# Patient Record
Sex: Male | Born: 1990 | Race: Black or African American | Hispanic: No | Marital: Single | Smoking: Never smoker
Health system: Southern US, Community
[De-identification: ages and names within clinical notes are randomized; demographics above are authoritative.]

## PROBLEM LIST (undated history)

## (undated) DIAGNOSIS — I861 Scrotal varices: Secondary | ICD-10-CM

## (undated) DIAGNOSIS — F419 Anxiety disorder, unspecified: Secondary | ICD-10-CM

## (undated) DIAGNOSIS — A630 Anogenital (venereal) warts: Secondary | ICD-10-CM

## (undated) DIAGNOSIS — L299 Pruritus, unspecified: Secondary | ICD-10-CM

---

## 2015-02-21 DIAGNOSIS — I861 Scrotal varices: Secondary | ICD-10-CM

## 2015-02-21 HISTORY — DX: Scrotal varices: I86.1

## 2015-03-07 ENCOUNTER — Other Ambulatory Visit: Payer: Self-pay | Admitting: Family Medicine

## 2015-03-07 DIAGNOSIS — N50819 Testicular pain, unspecified: Secondary | ICD-10-CM

## 2015-03-11 ENCOUNTER — Ambulatory Visit
Admission: RE | Admit: 2015-03-11 | Discharge: 2015-03-11 | Disposition: A | Discharge status: Home / Self Care | Payer: BLUE CROSS/BLUE SHIELD | Source: Ambulatory Visit | Attending: Family Medicine | Admitting: Family Medicine

## 2015-03-11 DIAGNOSIS — N50819 Testicular pain, unspecified: Secondary | ICD-10-CM

## 2016-08-07 IMAGING — US US ART/VEN ABD/PELV/SCROTUM DOPPLER LTD
1 series · 14 of 25 positions shown · non-contrast
Comparison: None.

CLINICAL DATA: Chronic intermittent testicle pain.

EXAM:
SCROTAL ULTRASOUND
DOPPLER ULTRASOUND OF THE TESTICLES
TECHNIQUE: Complete ultrasound examination of the testicles, epididymis, and
other scrotal structures was performed. Color and spectral Doppler
ultrasound were also utilized to evaluate blood flow to the
testicles.

[Series 1: us art/ven abd/pelv/scrotum doppler ltd · 0.06mm/px · 14 of 60 slices shown]
[im 1/60]
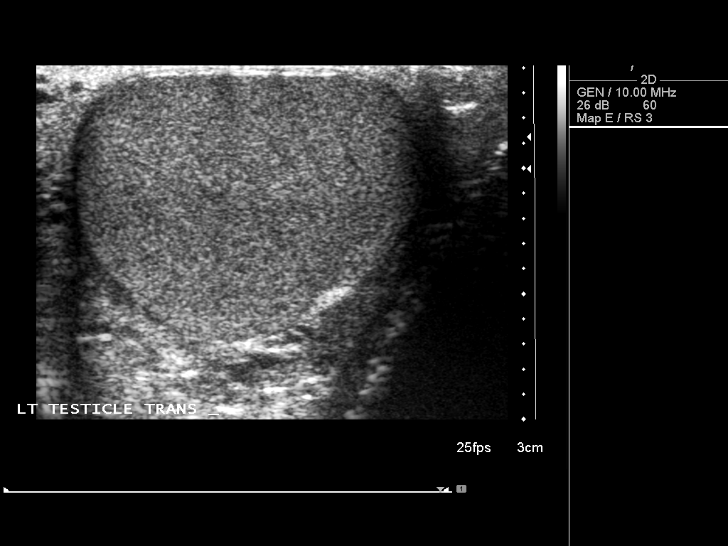
[im 5/60]
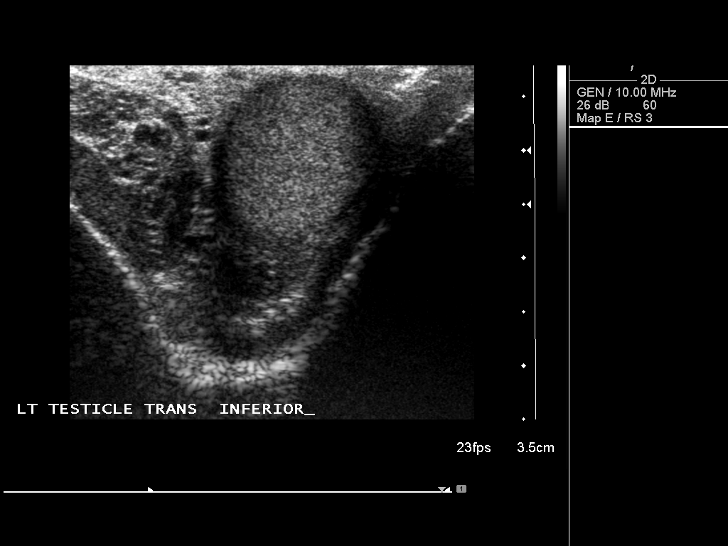
[im 10/60]
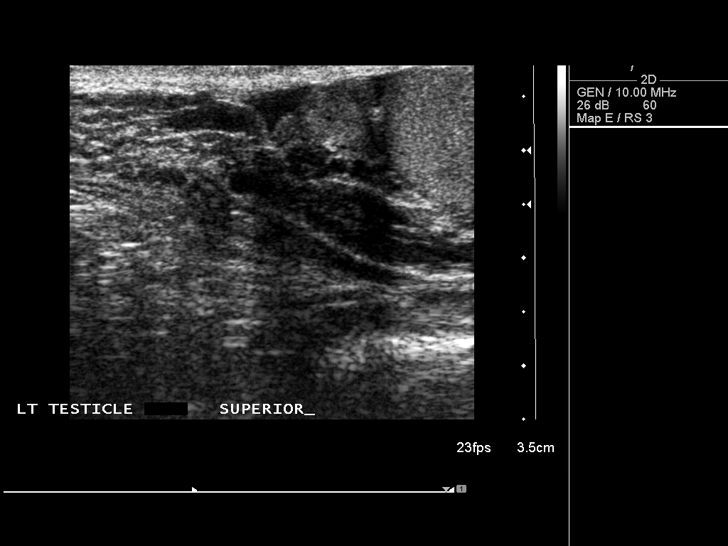
[im 15/60]
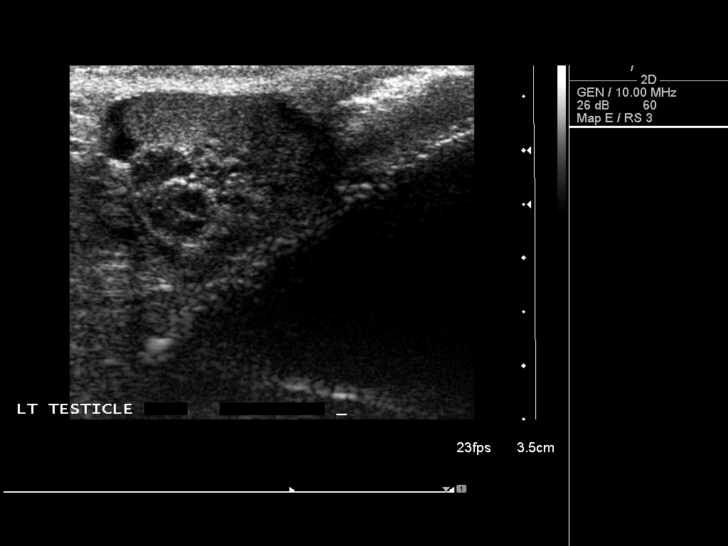
[im 20/60]
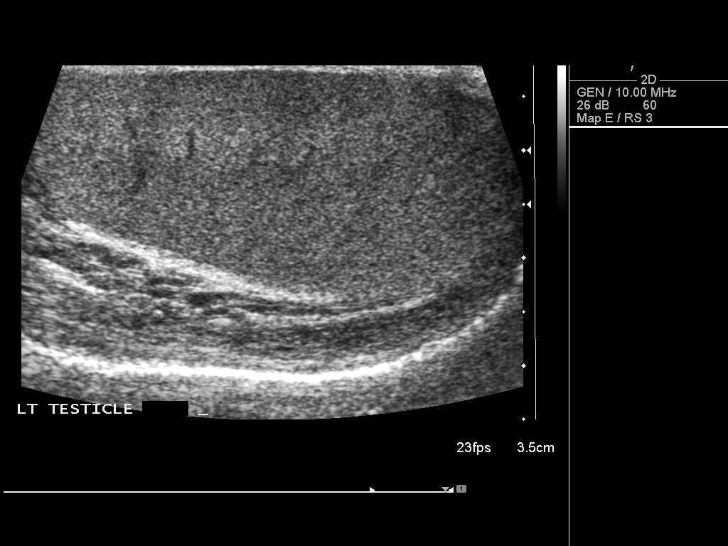
[im 23/60]
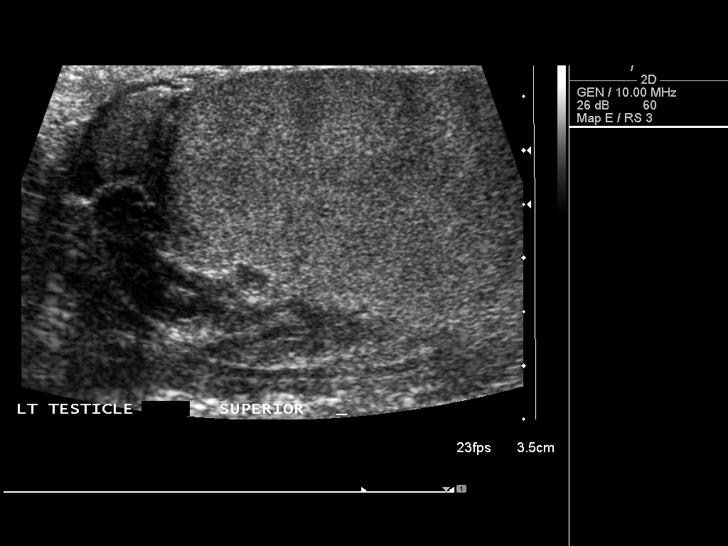
[im 28/60]
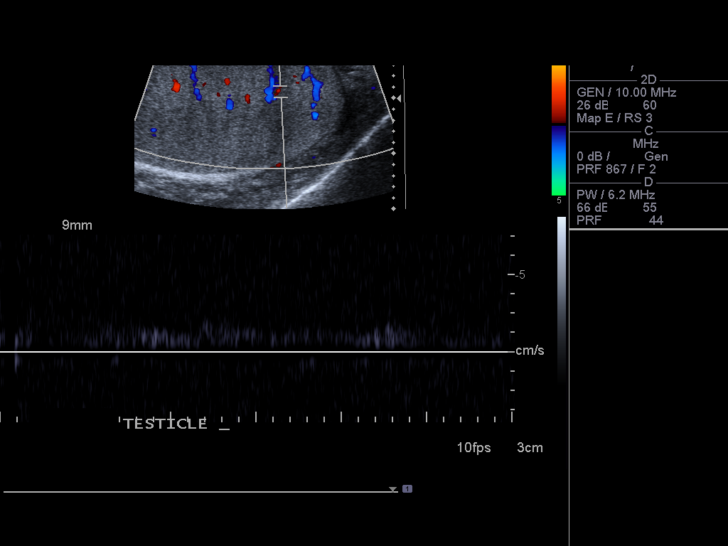
[im 32/60]
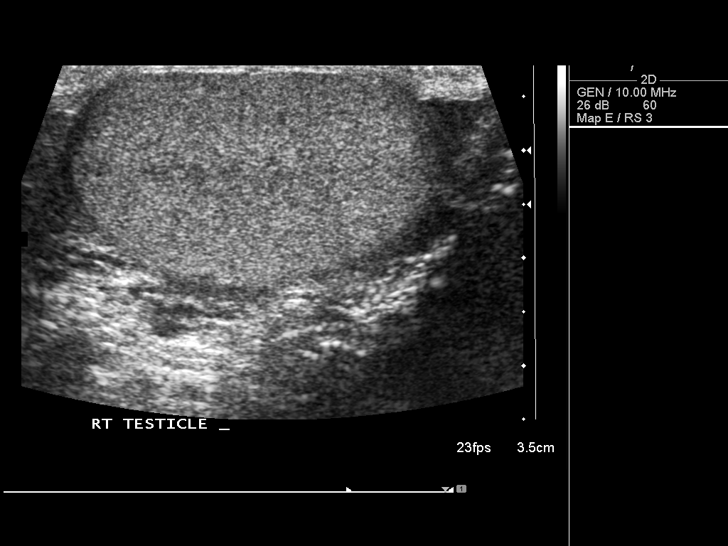
[im 37/60]
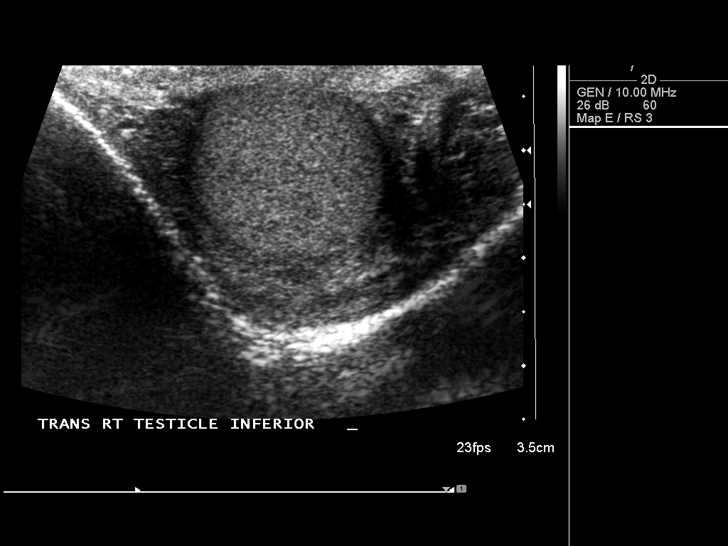
[im 40/60]
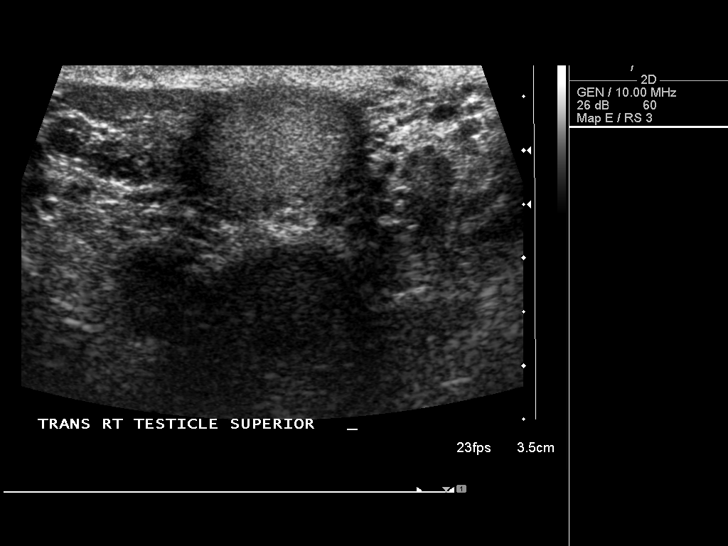
[im 45/60]
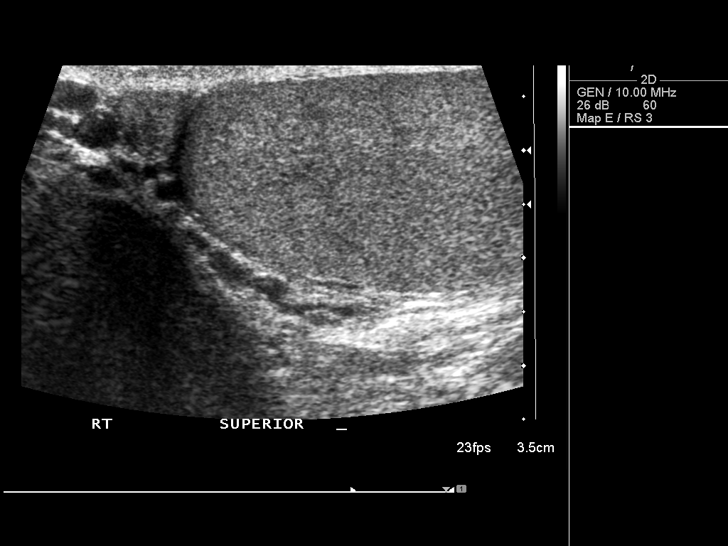
[im 50/60]
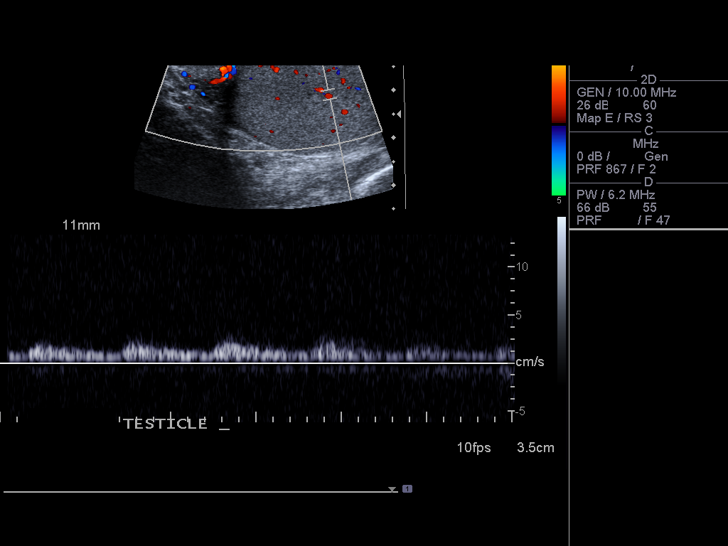
[im 55/60]
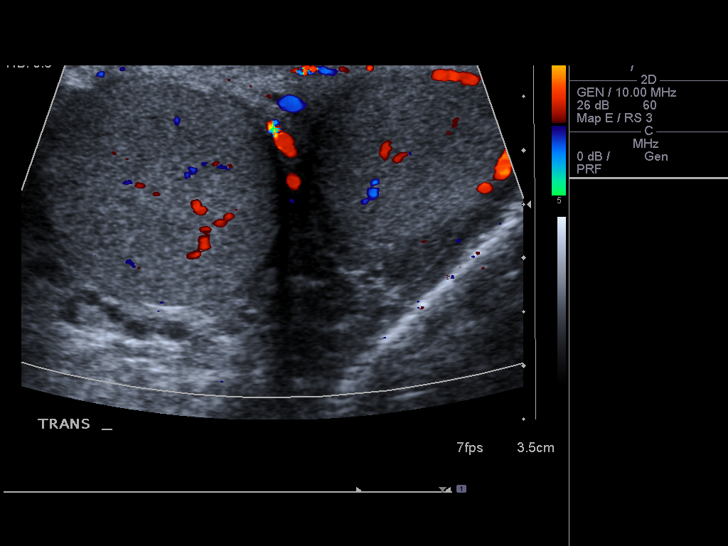
[im 60/60]
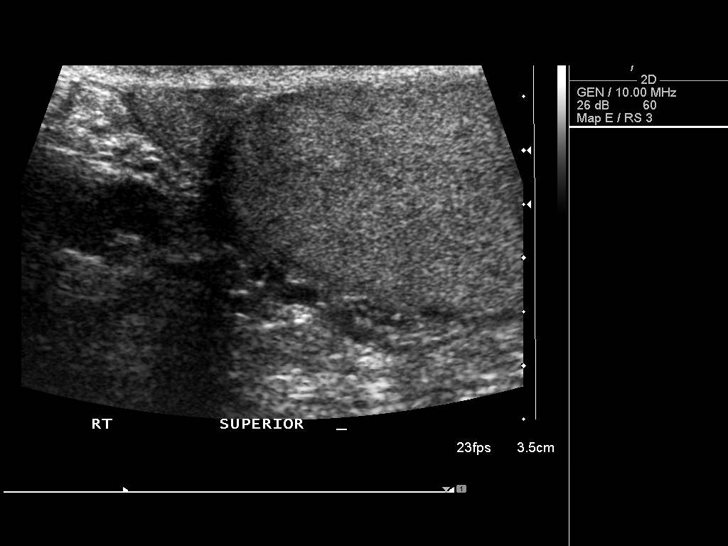

[14 of 25 positions shown; findings below may reference images not displayed]

FINDINGS: Right testicle

Measurements: 4.4 x 2.4 x 2.9 cm. No mass or microlithiasis
visualized.

Left testicle

Measurements: 4.5 x 2.3 x 2.8 cm. No mass or microlithiasis
visualized.

Right epididymis:  Normal in size and appearance.

Left epididymis: Normal in size. 1.5 mm cyst in the epididymal head.

Hydrocele:  None visualized.

Varicocele:  Small left varicocele.

Pulsed Doppler interrogation of both testes demonstrates normal low
resistance arterial and venous waveforms bilaterally.
IMPRESSION: Normal appearing testicles.  Small left varicocele.

## 2017-06-27 ENCOUNTER — Encounter (HOSPITAL_COMMUNITY): Payer: Self-pay | Admitting: Emergency Medicine

## 2017-06-27 ENCOUNTER — Ambulatory Visit (HOSPITAL_COMMUNITY)
Admission: EM | Admit: 2017-06-27 | Discharge: 2017-06-27 | Disposition: A | Discharge status: Home / Self Care | Payer: BLUE CROSS/BLUE SHIELD | Attending: Family Medicine | Admitting: Family Medicine

## 2017-06-27 DIAGNOSIS — L299 Pruritus, unspecified: Secondary | ICD-10-CM

## 2017-06-27 DIAGNOSIS — F411 Generalized anxiety disorder: Secondary | ICD-10-CM

## 2017-06-27 HISTORY — DX: Anxiety disorder, unspecified: F41.9

## 2017-06-27 MED ORDER — LORAZEPAM 0.5 MG PO TABS: 0.5000 mg | ORAL_TABLET | Freq: Three times a day (TID) | ORAL | 0 refills | Status: DC | PRN

## 2017-06-27 MED ORDER — PREDNISONE 10 MG (21) PO TBPK: ORAL_TABLET | ORAL | 0 refills | Status: DC

## 2017-06-27 NOTE — ED Triage Notes (Signed)
Pt sts "things crawling under skin" pt sts unsure if it is his anxiety; pt denies SI/HI

## 2017-07-03 NOTE — ED Provider Notes (Signed)
  [facility]   [id number] 06/27/17 Arrival Time: 1928  ASSESSMENT & PLAN:  1. Pruritus   2. Generalized anxiety disorder     Meds ordered this encounter  Medications  . LORazepam (ATIVAN) 0.5 MG tablet    Sig: Take 1 tablet (0.5 mg total) by mouth 3 (three) times daily as needed for anxiety.    Dispense:  10 tablet    Refill:  0  . predniSONE (STERAPRED UNI-PAK 21 TAB) 10 MG (21) TBPK tablet    Sig: Take as directed.    Dispense:  21 tablet    Refill:  0   After discussion, agree to short term use of Ativan and prednisone. Monitor closely. Recommend f/u in a few days to see how he's doing.  Reviewed expectations re: course of current medical issues. Questions answered. Outlined signs and symptoms indicating need for more acute intervention. Patient verbalized understanding. After Visit Summary given.   SUBJECTIVE:  Edwin Ortega is a 26 y.o. male who presents with complaint of generalized "itching" for the past few weeks. He questions if this is related to his anxiety which he has struggled with off/on for the past few years. No medications taken. No rashes. No recent illnesses. No self-treatment. Reports itching fairly persistent; maybe worse at night. No specific aggravating or alleviating factors reported.  ROS: As per HPI.   OBJECTIVE:  Vitals:   06/27/17 1947  BP: 130/90  Pulse: 65  Resp: 18  Temp: (!) 97.5 F (36.4 C)  TempSrc: Oral  SpO2: 100%    General appearance: alert; no distress; appears slightly anxious Eyes: PERRLA; EOMI; conjunctiva normal HENT: normocephalic; atraumatic; TMs normal; nasal mucosa normal; oral mucosa normal Neck: supple Lungs: clear to auscultation bilaterally Heart: regular rate and rhythm Skin: warm and dry without rashes Neurologic: normal gait; normal symmetric reflexes Psychological: alert and cooperative; normal mood and affect  No Known Allergies  Past Medical History:  Diagnosis Date  . Anxiety    Social  History   Socioeconomic History  . Marital status: Single    Spouse name: Not on file  . Number of children: Not on file  . Years of education: Not on file  . Highest education level: Not on file  Social Needs  . Financial resource strain: Not on file  . Food insecurity - worry: Not on file  . Food insecurity - inability: Not on file  . Transportation needs - medical: Not on file  . Transportation needs - non-medical: Not on file  Occupational History  . Not on file  Tobacco Use  . Smoking status: Never Smoker  . Smokeless tobacco: Never Used  Substance and Sexual Activity  . Alcohol use: No    Frequency: Never  . Drug use: No  . Sexual activity: Not on file  Other Topics Concern  . Not on file  Social History Narrative  . Not on file     Mardella LaymanHagler, Kindal Ponti, MD 07/03/17 [phone]

## 2017-07-13 ENCOUNTER — Ambulatory Visit (HOSPITAL_COMMUNITY)
Admission: EM | Admit: 2017-07-13 | Discharge: 2017-07-13 | Disposition: A | Discharge status: Home / Self Care | Payer: BLUE CROSS/BLUE SHIELD | Attending: Family Medicine | Admitting: Family Medicine

## 2017-07-13 ENCOUNTER — Encounter (HOSPITAL_COMMUNITY): Payer: Self-pay | Admitting: Emergency Medicine

## 2017-07-13 ENCOUNTER — Other Ambulatory Visit: Payer: Self-pay

## 2017-07-13 DIAGNOSIS — L299 Pruritus, unspecified: Secondary | ICD-10-CM

## 2017-07-13 MED ORDER — HYDROXYZINE HCL 25 MG PO TABS: 25.0000 mg | ORAL_TABLET | Freq: Three times a day (TID) | ORAL | 0 refills | Status: DC | PRN

## 2017-07-13 MED ORDER — CETIRIZINE HCL 10 MG PO TABS: 10.0000 mg | ORAL_TABLET | Freq: Every day | ORAL | 0 refills | Status: DC

## 2017-07-13 NOTE — ED Triage Notes (Signed)
Pt reports itching all over.  He was seen and treated for this 12/06 with prednisone and he states that helped.  He was also prescribed anxiety medicine that he has already taken all of as well.  He does not know if this is anxiety related.

## 2017-07-13 NOTE — Discharge Instructions (Signed)
May try daily anthistamine to see if this is helpful with your itching. Hydroxyzine three times a day as needed for anxiety and/or itching may also help with your symptoms. Please establish with a primary care provider for recheck of your symptoms in the next month.  If symptoms worsen or do not improve in the next week to return to be seen.

## 2017-07-13 NOTE — ED Provider Notes (Signed)
[facility]    CSN: [id number] Arrival date & time: 07/13/17  1237     History   Chief Complaint Chief Complaint  Patient presents with  . Pruritis    HPI Edwin Ortega is a 26 y.o. male.   Edwin Ortega presents with complaints of itching sensation throughout his body. States it feels like something is crawling under his skin. This has been ongoing for over 1 month. Denies drug abuse or use of drugs. Took a course of prednisone and 1 week of ativan which he feels did help his symptoms. Woke last night with increased itching. States he feels it to his face head, arms, trunk, legs. Without rash. Without medical history, frequent urination, increased thirst, abdominal pain, nausea, vomiting or diarrhea. States he does feel he might suffer from anxiety. Saw his PCP and was given cream for eczema over 1 month ago but does not feel this helped. Does not have a local PCP. Denies any previous similar incident.    ROS per HPI.       Past Medical History:  Diagnosis Date  . Anxiety     There are no active problems to display for this patient.   History reviewed. No pertinent surgical history.     Home Medications    Prior to Admission medications   Medication Sig Start Date End Date Taking? Authorizing Provider  cetirizine (ZYRTEC) 10 MG tablet Take 1 tablet (10 mg total) by mouth daily. 07/13/17   Georgetta HaberBurky, Laniya Friedl B, NP  hydrOXYzine (ATARAX/VISTARIL) 25 MG tablet Take 1 tablet (25 mg total) by mouth 3 (three) times daily as needed for anxiety or itching. 07/13/17   Georgetta HaberBurky, Santez Woodcox B, NP    Family History History reviewed. No pertinent family history.  Social History Social History   Tobacco Use  . Smoking status: Never Smoker  . Smokeless tobacco: Never Used  Substance Use Topics  . Alcohol use: No    Frequency: Never  . Drug use: No     Allergies   Patient has no known allergies.   Review of Systems Review of Systems   Physical Exam Triage Vital  Signs ED Triage Vitals [07/13/17 1324]  Enc Vitals Group     BP 128/83     Pulse Rate 67     Resp      Temp 98.5 F (36.9 C)     Temp Source Oral     SpO2 100 %     Weight      Height      Head Circumference      Peak Flow      Pain Score      Pain Loc      Pain Edu?      Excl. in GC?    No data found.  Updated Vital Signs BP 128/83 (BP Location: Left Arm)   Pulse 67   Temp 98.5 F (36.9 C) (Oral)   SpO2 100%   Visual Acuity Right Eye Distance:   Left Eye Distance:   Bilateral Distance:    Right Eye Near:   Left Eye Near:    Bilateral Near:     Physical Exam  Constitutional: He is oriented to person, place, and time. He appears well-developed and well-nourished.  HENT:  Nose: No mucosal edema or rhinorrhea.  Cardiovascular: Normal rate and regular rhythm.  Pulmonary/Chest: Effort normal and breath sounds normal.  Neurological: He is alert and oriented to person, place, and time.  Skin: Skin is warm and  dry. No rash noted. No erythema. No pallor.  Psychiatric: He has a normal mood and affect. His speech is normal and behavior is normal.     UC Treatments / Results  Labs (all labs ordered are listed, but only abnormal results are displayed) Labs Reviewed - No data to display  EKG  EKG Interpretation None       Radiology No results found.  Procedures Procedures (including critical care time)  Medications Ordered in UC Medications - No data to display   Initial Impression / Assessment and Plan / UC Course  I have reviewed the triage vital signs and the nursing notes.  Pertinent labs & imaging results that were available during my care of the patient were reviewed by me and considered in my medical decision making (see chart for details).     Benign physical findings on exam, without skin rash, URI symptoms, without acute distress or apparent anxiety currently. Will try daily antihistamine as well as prn vistaril. Recommend establish with a  local PCP as may need further evaluation if symptoms persist or do not improve. Patient verbalized understanding and agreeable to plan.    Final Clinical Impressions(s) / UC Diagnoses   Final diagnoses:  Pruritus    ED Discharge Orders        Ordered    hydrOXYzine (ATARAX/VISTARIL) 25 MG tablet  3 times daily PRN     07/13/17 1422    cetirizine (ZYRTEC) 10 MG tablet  Daily     07/13/17 1422       Controlled Substance Prescriptions [place] Controlled Substance Registry consulted? Not Applicable   Georgetta HaberBurky, Mathieu Schloemer B, NP 07/13/17 1431

## 2017-12-06 ENCOUNTER — Encounter (HOSPITAL_COMMUNITY): Payer: Self-pay | Admitting: Nurse Practitioner

## 2017-12-06 ENCOUNTER — Emergency Department (HOSPITAL_COMMUNITY)
Admission: EM | Admit: 2017-12-06 | Discharge: 2017-12-07 | Discharge status: Against Medical Advice | Payer: BLUE CROSS/BLUE SHIELD | Attending: Emergency Medicine | Admitting: Emergency Medicine

## 2017-12-06 DIAGNOSIS — Z5321 Procedure and treatment not carried out due to patient leaving prior to being seen by health care provider: Secondary | ICD-10-CM | POA: Insufficient documentation

## 2017-12-06 DIAGNOSIS — M542 Cervicalgia: Secondary | ICD-10-CM | POA: Diagnosis present

## 2017-12-06 NOTE — ED Triage Notes (Signed)
Pt is c/o throbbing neck pain associated with decreased ROM to neck. States he woke up that way this morning and has not improved.

## 2017-12-07 NOTE — ED Notes (Signed)
No answer when called for a room. 

## 2018-02-07 ENCOUNTER — Encounter (HOSPITAL_COMMUNITY): Payer: Self-pay

## 2018-02-07 ENCOUNTER — Ambulatory Visit (HOSPITAL_COMMUNITY)
Admission: EM | Admit: 2018-02-07 | Discharge: 2018-02-07 | Disposition: A | Discharge status: Home / Self Care | Payer: BLUE CROSS/BLUE SHIELD | Attending: Internal Medicine | Admitting: Internal Medicine

## 2018-02-07 DIAGNOSIS — X58XXXA Exposure to other specified factors, initial encounter: Secondary | ICD-10-CM | POA: Insufficient documentation

## 2018-02-07 DIAGNOSIS — S3093XA Unspecified superficial injury of penis, initial encounter: Secondary | ICD-10-CM

## 2018-02-07 DIAGNOSIS — S3994XA Unspecified injury of external genitals, initial encounter: Secondary | ICD-10-CM | POA: Diagnosis present

## 2018-02-07 MED ORDER — MUPIROCIN 2 % EX OINT: 1.0000 "application " | TOPICAL_OINTMENT | Freq: Two times a day (BID) | CUTANEOUS | 0 refills | Status: DC

## 2018-02-07 NOTE — Discharge Instructions (Signed)
You can apply bactroban to affected site. Avoid sexual activity for the next 1-2 weeks to allow area to heal. Cytology sent, you will be contacted with any positive results that requires further treatment. Follow up with urology if symptoms not improving.

## 2018-02-07 NOTE — ED Provider Notes (Signed)
[facility]    CSN: [id number] Arrival date & time: 02/07/18  1823     History   Chief Complaint Chief Complaint  Patient presents with  . Penis Injury    HPI Edwin Ortega is a 27 y.o. male.   27 year old male comes in for injury to the penis. States had a small tear around the urethra after sexual intercourse with his partner.  States for the past week, feels like the area is getting worse, and has had some mild penile discharge to the area.  He denies dysuria, urinary frequency, hematuria.  Denies penile lesion, testicular swelling, testicular pain.  Denies abdominal pain, nausea, vomiting.  Denies fever, chills, night sweats.  He is sexually active with one male partner, condom use.  Patient has continued to be sexually active since symptom onset.     Past Medical History:  Diagnosis Date  . Anxiety     There are no active problems to display for this patient.   History reviewed. No pertinent surgical history.     Home Medications    Prior to Admission medications   Medication Sig Start Date End Date Taking? Authorizing Provider  cetirizine (ZYRTEC) 10 MG tablet Take 1 tablet (10 mg total) by mouth daily. 07/13/17   Georgetta HaberBurky, Natalie B, NP  hydrOXYzine (ATARAX/VISTARIL) 25 MG tablet Take 1 tablet (25 mg total) by mouth 3 (three) times daily as needed for anxiety or itching. 07/13/17   Georgetta HaberBurky, Natalie B, NP  mupirocin ointment (BACTROBAN) 2 % Apply 1 application topically 2 (two) times daily. 02/07/18   Belinda FisherYu, Amy V, PA-C    Family History Family History  Problem Relation Age of Onset  . Healthy Mother   . Healthy Father     Social History Social History   Tobacco Use  . Smoking status: Never Smoker  . Smokeless tobacco: Never Used  Substance Use Topics  . Alcohol use: No    Frequency: Never  . Drug use: No     Allergies   Patient has no known allergies.   Review of Systems Review of Systems  Reason unable to perform ROS: See HPI as  above.     Physical Exam Triage Vital Signs ED Triage Vitals  Enc Vitals Group     BP 02/07/18 1915 136/83     Pulse Rate 02/07/18 1915 62     Resp 02/07/18 1915 18     Temp 02/07/18 1915 98 F (36.7 C)     Temp src --      SpO2 02/07/18 1915 97 %     Weight --      Height --      Head Circumference --      Peak Flow --      Pain Score 02/07/18 1914 0     Pain Loc --      Pain Edu? --      Excl. in GC? --    No data found.  Updated Vital Signs BP 136/83   Pulse 62   Temp 98 F (36.7 C)   Resp 18   SpO2 97%   Physical Exam  Constitutional: He is oriented to person, place, and time. He appears well-developed and well-nourished. No distress.  HENT:  Head: Normocephalic and atraumatic.  Eyes: Pupils are equal, round, and reactive to light. Conjunctivae are normal.  Genitourinary:  Genitourinary Comments: Small tear to the 6:00 region of the urethra.  No erythema, bleeding.  No tenderness to palpation.  No other penile lesions/wound seen.  Neurological: He is alert and oriented to person, place, and time.  Skin: He is not diaphoretic.    UC Treatments / Results  Labs (all labs ordered are listed, but only abnormal results are displayed) Labs Reviewed  URINE CYTOLOGY ANCILLARY ONLY    EKG None  Radiology No results found.  Procedures Procedures (including critical care time)  Medications Ordered in UC Medications - No data to display  Initial Impression / Assessment and Plan / UC Course  I have reviewed the triage vital signs and the nursing notes.  Pertinent labs & imaging results that were available during my care of the patient were reviewed by me and considered in my medical decision making (see chart for details).    Bactroban on affected area.  Discussed with patient to avoid sexual activity for the next 1 to 2 weeks to allow healing to the area.  Cytology sent.  Return precautions given.  Patient expresses understanding and agrees to  plan.  Final Clinical Impressions(s) / UC Diagnoses   Final diagnoses:  Superficial injury of penis, initial encounter    ED Prescriptions    Medication Sig Dispense Auth. Provider   mupirocin ointment (BACTROBAN) 2 % Apply 1 application topically 2 (two) times daily. 22 g Threasa Alpha, [place] 02/07/18 1949

## 2018-02-07 NOTE — ED Triage Notes (Signed)
Pt reports having a tear on his penis x 2 days and now is noticing some discharge from the area.

## 2018-02-10 LAB — URINE CYTOLOGY ANCILLARY ONLY
CHLAMYDIA, DNA PROBE: NEGATIVE
NEISSERIA GONORRHEA: NEGATIVE
Trichomonas: NEGATIVE

## 2018-03-10 ENCOUNTER — Other Ambulatory Visit: Payer: Self-pay

## 2018-03-10 ENCOUNTER — Other Ambulatory Visit: Payer: Self-pay | Admitting: Urology

## 2018-03-10 ENCOUNTER — Encounter (HOSPITAL_BASED_OUTPATIENT_CLINIC_OR_DEPARTMENT_OTHER): Payer: Self-pay

## 2018-03-10 NOTE — Progress Notes (Signed)
Spoke with:  [place] NPO:  After Midnight, no gum, candy, or mints   Arrival time:  0800AM Labs: N/A AM medications: Duloxetine Pre op orders: need second sign Ride home:  [place] ([place]) [phone]

## 2018-03-12 NOTE — H&P (Signed)
CC/HPI: CC: Penile injury.   Hx: Edwin Ortega is a 27 yo BM who is sent in consultation by Dr. Janna ArchonDiego for a possible penile injury. He had a tear of the glans after sexual activity. He went to the ER and got Mupiricin ointment. He delayed sex for 2 weeks and then after he had sex again it bled again. It bled most of the day yesterday and has stopped. He had mild burning with voiding from contact with the injury. He has had no prior issues with the penis or STD's. He does have some obstructive symptoms that predate this problem and the initiation of cymbalta which he takes for depression.     CC: AUA Questions Scoring.  HPI: Edwin Ortega is a 27 year-old male patient who was referred by Dr. Oval Linseyichard DonDiego, MD who is here for evaluation.      AUA Symptom Score: Less than 20% of the time he has the sensation of not emptying his bladder completely when finished urinating. Less than 20% of the time he has to urinate again fewer than two hours after he has finished urinating. He does not have to stop and start again several times when he urinates. He never finds it difficult to postpone urination. More than 50% of the time he has a weak urinary stream. More than 50% of the time he has to push or strain to begin urination. He has to get up to urinate 2 times from the time he goes to bed until the time he gets up in the morning.   Calculated AUA Symptom Score: 12    ALLERGIES: None   MEDICATIONS: Cymbalta 20 mg capsule,delayed release  Vitamin B12     GU PSH: None   NON-GU PSH: None   GU PMH: None   NON-GU PMH: Depression    FAMILY HISTORY: None   SOCIAL HISTORY: Marital Status: Single Preferred Language: English; Race: Black or African American Current Smoking Status: Patient has never smoked.   Tobacco Use Assessment Completed: Used Tobacco in last 30 days? Does not use smokeless tobacco. Has never drank.  Does not use drugs. Drinks 1 caffeinated drink per day. Has not had a blood  transfusion.    REVIEW OF SYSTEMS:    GU Review Male:   Patient reports trouble starting your stream and penile pain. Patient denies frequent urination, hard to postpone urination, burning/ pain with urination, get up at night to urinate, leakage of urine, stream starts and stops, have to strain to urinate , and erection problems.  Gastrointestinal (Upper):   Patient denies nausea, vomiting, and indigestion/ heartburn.  Gastrointestinal (Lower):   Patient denies diarrhea and constipation.  Constitutional:   Patient denies fever, night sweats, weight loss, and fatigue.  Skin:   Patient denies skin rash/ lesion and itching.  Eyes:   Patient denies blurred vision and double vision.  Ears/ Nose/ Throat:   Patient denies sore throat and sinus problems.  Hematologic/Lymphatic:   Patient denies swollen glands and easy bruising.  Cardiovascular:   Patient denies chest pains and leg swelling.  Respiratory:   Patient denies cough and shortness of breath.  Endocrine:   Patient denies excessive thirst.  Musculoskeletal:   Patient denies back pain and joint pain.  Neurological:   Patient denies headaches and dizziness.  Psychologic:   Patient denies depression and anxiety.   Notes: Penile bleeding    VITAL SIGNS:      03/03/2018 04:10 PM  Weight 185 lb / 83.91 kg  Height  74 in / 187.96 cm  BP 140/92 mmHg  Heart Rate 92 /min  Temperature 97.2 F / 36.2 C  BMI 23.8 kg/m   GU PHYSICAL EXAMINATION:    Anus and Perineum: No hemorrhoids. No anal stenosis. No rectal fissure, no anal fissure. No edema, no dimple, no perineal tenderness, no anal tenderness.  Scrotum: No lesions. No edema. No cysts. No warts.  Epididymides: Right: no spermatocele, no masses, no cysts, no tenderness, no induration, no enlargement. Left: no spermatocele, no masses, no cysts, no tenderness, no induration, no enlargement.  Testes: No tenderness, no swelling, no enlargement left testes. No tenderness, no swelling, no  enlargement right testes. Normal location left testes. Normal location right testes. No mass, no cyst, no varicocele, no hydrocele left testes. No mass, no cyst, no varicocele, no hydrocele right testes.  Urethral Meatus: Urethral meatal warts on the ventral edge with the largest about 4-255mm. There is some evidence of spread just internally. Normal size. No lesion,, no balanitis, no discharge. Normal location.   Penis: Circumcised, no foreskin warts, no cracks. . No balanitis, no meatal stenosis.    MULTI-SYSTEM PHYSICAL EXAMINATION:    Constitutional: Well-nourished. No physical deformities. Normally developed. Good grooming.  Respiratory: No labored breathing, no use of accessory muscles. CTA  Cardiovascular: Normal temperature, RRR without murmur  Lymphatic: No enlargement, no tenderness of groin lymph nodes.  Skin: No paleness, no jaundice, no cyanosis. No lesion, no ulcer, no rash.  Neurologic / Psychiatric: Oriented to time, oriented to place, oriented to person. No depression, no anxiety, no agitation.  Gastrointestinal: No hernia. no tenderness, no rigidity, non obese abdomen.   Musculoskeletal: Normal gait and station of head and neck.     PAST DATA REVIEWED:  Source Of History:  Patient  Records Review:   AUA Symptom Score  Urine Test Review:   Urinalysis   PROCEDURES:          Urinalysis w/Scope Dipstick Dipstick Cont'd Micro  Color: Yellow Bilirubin: Neg mg/dL WBC/hpf: 0 - 5/hpf  Appearance: Clear Ketones: Neg mg/dL RBC/hpf: 3 - 09/WJX10/hpf  Specific Gravity: 1.025 Blood: 1+ ery/uL Bacteria: NS (Not Seen)  pH: 6.0 Protein: 1+ mg/dL Cystals: NS (Not Seen)  Glucose: Neg mg/dL Urobilinogen: 0.2 mg/dL Casts: NS (Not Seen)    Nitrites: Neg Trichomonas: Not Present    Leukocyte Esterase: Neg leu/uL Mucous: Present      Epithelial Cells: 0 - 5/hpf      Yeast: NS (Not Seen)      Sperm: Not Present    ASSESSMENT:      ICD-10 Details  1 GU:   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 I am going to get him set up for laser ablation with cystourethroscopy. I have reviewed the risks of bleeding, infection, stricture, penile injury, recurrence, malignant degeneration, thrombotic events and anesthetic complications.   2   Microscopic hematuria - R31.21 the hematuria is probably from the condyloma but I will inspect the lower tract.   3   Weak Urinary Stream - R39.12 I will assess the outlet at cystoscopy but he may need a flowrate at f/u.    PLAN:           Schedule Return Visit/Planned Activity: ASAP - Schedule Surgery          Document Letter(s):  Created for Patient: Clinical Summary         Notes:   CC: Dr. Oval Linseyichard DonDiego.

## 2018-03-13 ENCOUNTER — Encounter (HOSPITAL_BASED_OUTPATIENT_CLINIC_OR_DEPARTMENT_OTHER)
Admission: RE | Disposition: A | Discharge status: Home / Self Care | Payer: Self-pay | Source: Ambulatory Visit | Attending: Urology

## 2018-03-13 ENCOUNTER — Encounter (HOSPITAL_BASED_OUTPATIENT_CLINIC_OR_DEPARTMENT_OTHER): Payer: Self-pay

## 2018-03-13 ENCOUNTER — Ambulatory Visit (HOSPITAL_BASED_OUTPATIENT_CLINIC_OR_DEPARTMENT_OTHER): Payer: BLUE CROSS/BLUE SHIELD | Admitting: Anesthesiology

## 2018-03-13 ENCOUNTER — Ambulatory Visit (HOSPITAL_BASED_OUTPATIENT_CLINIC_OR_DEPARTMENT_OTHER)
Admission: RE | Admit: 2018-03-13 | Discharge: 2018-03-13 | Disposition: A | Discharge status: Home / Self Care | Payer: BLUE CROSS/BLUE SHIELD | Source: Ambulatory Visit | Attending: Urology | Admitting: Urology

## 2018-03-13 DIAGNOSIS — R3912 Poor urinary stream: Secondary | ICD-10-CM | POA: Insufficient documentation

## 2018-03-13 DIAGNOSIS — R3121 Asymptomatic microscopic hematuria: Secondary | ICD-10-CM | POA: Insufficient documentation

## 2018-03-13 DIAGNOSIS — F329 Major depressive disorder, single episode, unspecified: Secondary | ICD-10-CM | POA: Insufficient documentation

## 2018-03-13 DIAGNOSIS — A630 Anogenital (venereal) warts: Secondary | ICD-10-CM | POA: Insufficient documentation

## 2018-03-13 DIAGNOSIS — Z79899 Other long term (current) drug therapy: Secondary | ICD-10-CM | POA: Diagnosis not present

## 2018-03-13 HISTORY — DX: Scrotal varices: I86.1

## 2018-03-13 HISTORY — PX: CYSTOSCOPY: SHX5120

## 2018-03-13 HISTORY — DX: Anogenital (venereal) warts: A63.0

## 2018-03-13 HISTORY — DX: Pruritus, unspecified: L29.9

## 2018-03-13 HISTORY — PX: CONDYLOMA EXCISION/FULGURATION: SHX1389

## 2018-03-13 SURGERY — CYSTOSCOPY
Anesthesia: General | Site: Urethra

## 2018-03-13 MED ORDER — CEFAZOLIN SODIUM-DEXTROSE 2-4 GM/100ML-% IV SOLN
2.0000 g | INTRAVENOUS | Status: AC
  Administered 2018-03-13: 2 g via INTRAVENOUS
  Filled 2018-03-13: qty 100

## 2018-03-13 MED ORDER — MIDAZOLAM HCL 2 MG/2ML IJ SOLN
INTRAMUSCULAR | Status: AC
  Filled 2018-03-13: qty 2

## 2018-03-13 MED ORDER — PROPOFOL 10 MG/ML IV BOLUS
INTRAVENOUS | Status: DC | PRN
  Administered 2018-03-13: 50 mg via INTRAVENOUS
  Administered 2018-03-13: 200 mg via INTRAVENOUS

## 2018-03-13 MED ORDER — MORPHINE SULFATE (PF) 2 MG/ML IV SOLN
2.0000 mg | INTRAVENOUS | Status: DC | PRN
  Filled 2018-03-13: qty 1

## 2018-03-13 MED ORDER — DEXAMETHASONE SODIUM PHOSPHATE 4 MG/ML IJ SOLN
INTRAMUSCULAR | Status: DC | PRN
  Administered 2018-03-13: 10 mg via INTRAVENOUS

## 2018-03-13 MED ORDER — STERILE WATER FOR IRRIGATION IR SOLN
Status: DC | PRN
  Administered 2018-03-13: 3000 mL

## 2018-03-13 MED ORDER — LACTATED RINGERS IV SOLN
INTRAVENOUS | Status: DC
  Administered 2018-03-13: 09:00:00 via INTRAVENOUS
  Filled 2018-03-13: qty 1000

## 2018-03-13 MED ORDER — MIDAZOLAM HCL 5 MG/5ML IJ SOLN
INTRAMUSCULAR | Status: DC | PRN
  Administered 2018-03-13: 2 mg via INTRAVENOUS

## 2018-03-13 MED ORDER — SODIUM CHLORIDE 0.9% FLUSH
3.0000 mL | INTRAVENOUS | Status: DC | PRN
  Filled 2018-03-13: qty 3

## 2018-03-13 MED ORDER — OXYCODONE HCL 5 MG PO TABS
5.0000 mg | ORAL_TABLET | ORAL | Status: DC | PRN
  Filled 2018-03-13: qty 2

## 2018-03-13 MED ORDER — SODIUM CHLORIDE 0.9 % IV SOLN
250.0000 mL | INTRAVENOUS | Status: DC | PRN
  Filled 2018-03-13: qty 250

## 2018-03-13 MED ORDER — PROMETHAZINE HCL 25 MG/ML IJ SOLN
6.2500 mg | INTRAMUSCULAR | Status: DC | PRN
  Filled 2018-03-13: qty 1

## 2018-03-13 MED ORDER — CEFAZOLIN SODIUM-DEXTROSE 2-4 GM/100ML-% IV SOLN
INTRAVENOUS | Status: AC
  Filled 2018-03-13: qty 100

## 2018-03-13 MED ORDER — ONDANSETRON HCL 4 MG/2ML IJ SOLN
INTRAMUSCULAR | Status: AC
  Filled 2018-03-13: qty 2

## 2018-03-13 MED ORDER — PHENAZOPYRIDINE HCL 200 MG PO TABS: 200.0000 mg | ORAL_TABLET | Freq: Three times a day (TID) | ORAL | 0 refills | Status: AC | PRN

## 2018-03-13 MED ORDER — ONDANSETRON HCL 4 MG/2ML IJ SOLN
INTRAMUSCULAR | Status: DC | PRN
  Administered 2018-03-13: 4 mg via INTRAVENOUS

## 2018-03-13 MED ORDER — LIDOCAINE 2% (20 MG/ML) 5 ML SYRINGE
INTRAMUSCULAR | Status: DC | PRN
  Administered 2018-03-13: 100 mg via INTRAVENOUS

## 2018-03-13 MED ORDER — ACETAMINOPHEN 650 MG RE SUPP
650.0000 mg | RECTAL | Status: DC | PRN
  Filled 2018-03-13: qty 1

## 2018-03-13 MED ORDER — SODIUM CHLORIDE 0.9% FLUSH
3.0000 mL | Freq: Two times a day (BID) | INTRAVENOUS | Status: DC
  Filled 2018-03-13: qty 3

## 2018-03-13 MED ORDER — ACETAMINOPHEN 325 MG PO TABS
650.0000 mg | ORAL_TABLET | ORAL | Status: DC | PRN
  Filled 2018-03-13: qty 2

## 2018-03-13 MED ORDER — DEXAMETHASONE SODIUM PHOSPHATE 10 MG/ML IJ SOLN
INTRAMUSCULAR | Status: AC
  Filled 2018-03-13: qty 1

## 2018-03-13 MED ORDER — LIDOCAINE 2% (20 MG/ML) 5 ML SYRINGE
INTRAMUSCULAR | Status: AC
  Filled 2018-03-13: qty 5

## 2018-03-13 MED ORDER — FENTANYL CITRATE (PF) 100 MCG/2ML IJ SOLN
INTRAMUSCULAR | Status: DC | PRN
  Administered 2018-03-13 (×2): 50 ug via INTRAVENOUS

## 2018-03-13 MED ORDER — PROPOFOL 500 MG/50ML IV EMUL
INTRAVENOUS | Status: AC
  Filled 2018-03-13: qty 100

## 2018-03-13 MED ORDER — FENTANYL CITRATE (PF) 100 MCG/2ML IJ SOLN
INTRAMUSCULAR | Status: AC
  Filled 2018-03-13: qty 2

## 2018-03-13 MED ORDER — SODIUM CHLORIDE 0.9 % IR SOLN
Status: DC | PRN
  Administered 2018-03-13: 500 mL

## 2018-03-13 MED ORDER — FENTANYL CITRATE (PF) 100 MCG/2ML IJ SOLN
25.0000 ug | INTRAMUSCULAR | Status: DC | PRN
  Filled 2018-03-13: qty 1

## 2018-03-13 MED ORDER — HYDROCODONE-ACETAMINOPHEN 5-325 MG PO TABS: 1.0000 | ORAL_TABLET | Freq: Four times a day (QID) | ORAL | 0 refills | Status: AC | PRN

## 2018-03-13 SURGICAL SUPPLY — 37 items
BAG DRAIN URO-CYSTO SKYTR STRL (DRAIN) ×3 IMPLANT
BASKET STONE 1.7 NGAGE (UROLOGICAL SUPPLIES) IMPLANT
BASKET ZERO TIP NITINOL 2.4FR (BASKET) IMPLANT
CATH URET 5FR 28IN CONE TIP (BALLOONS)
CATH URET 5FR 28IN OPEN ENDED (CATHETERS) ×3 IMPLANT
CATH URET 5FR 70CM CONE TIP (BALLOONS) IMPLANT
CLOTH BEACON ORANGE TIMEOUT ST (SAFETY) ×3 IMPLANT
DEPRESSOR TONGUE BLADE STERILE (MISCELLANEOUS) ×3 IMPLANT
ELECT REM PT RETURN 9FT ADLT (ELECTROSURGICAL)
ELECTRODE REM PT RTRN 9FT ADLT (ELECTROSURGICAL) IMPLANT
FIBER LASER FLEXIVA 365 (UROLOGICAL SUPPLIES) IMPLANT
FIBER LASER FLEXIVA 550 (UROLOGICAL SUPPLIES) ×3 IMPLANT
FIBER LASER TRAC TIP (UROLOGICAL SUPPLIES) IMPLANT
GLOVE BIO SURGEON STRL SZ 6.5 (GLOVE) ×3 IMPLANT
GLOVE BIO SURGEON STRL SZ8 (GLOVE) ×3 IMPLANT
GLOVE BIOGEL PI IND STRL 6.5 (GLOVE) ×2 IMPLANT
GLOVE BIOGEL PI IND STRL 7.5 (GLOVE) ×2 IMPLANT
GLOVE BIOGEL PI IND STRL 8.5 (GLOVE) ×2 IMPLANT
GLOVE BIOGEL PI INDICATOR 6.5 (GLOVE) ×1
GLOVE BIOGEL PI INDICATOR 7.5 (GLOVE) ×1
GLOVE BIOGEL PI INDICATOR 8.5 (GLOVE) ×1
GLOVE SURG SS PI 8.0 STRL IVOR (GLOVE) ×3 IMPLANT
GOWN STRL REUS W/TWL LRG LVL3 (GOWN DISPOSABLE) ×3 IMPLANT
GOWN STRL REUS W/TWL XL LVL3 (GOWN DISPOSABLE) ×3 IMPLANT
GUIDEWIRE 0.038 PTFE COATED (WIRE) IMPLANT
GUIDEWIRE ANG ZIPWIRE 038X150 (WIRE) IMPLANT
GUIDEWIRE STR DUAL SENSOR (WIRE) ×6 IMPLANT
IV NS IRRIG 3000ML ARTHROMATIC (IV SOLUTION) ×3 IMPLANT
KIT TURNOVER CYSTO (KITS) ×3 IMPLANT
MANIFOLD NEPTUNE II (INSTRUMENTS) ×3 IMPLANT
NS IRRIG 500ML POUR BTL (IV SOLUTION) ×3 IMPLANT
PACK BASIN DAY SURGERY FS (CUSTOM PROCEDURE TRAY) ×3 IMPLANT
PACK CYSTO (CUSTOM PROCEDURE TRAY) ×3 IMPLANT
TOWEL OR 17X24 6PK STRL BLUE (TOWEL DISPOSABLE) ×6 IMPLANT
TUBE CONNECTING 12X1/4 (SUCTIONS) ×3 IMPLANT
TUBING UROLOGY SET (TUBING) ×3 IMPLANT
VACUUM HOSE 7/8X10 W/ WAND (MISCELLANEOUS) ×3 IMPLANT

## 2018-03-13 NOTE — Discharge Instructions (Addendum)
CYSTOSCOPY HOME CARE INSTRUCTIONS  Activity: Rest for the remainder of the day.  Do not drive or operate equipment today.  You may resume normal activities in one to two days as instructed by your physician.   Meals: Drink plenty of liquids and eat light foods such as gelatin or soup this evening.  You may return to a normal meal plan tomorrow.  Return to Work: You may return to work in one to two days or as instructed by your physician.  Special Instructions / Symptoms: Call your physician if any of these symptoms occur:   -persistent or heavy bleeding  -bleeding which continues after first few urination  -large blood clots that are difficult to pass  -urine stream diminishes or stops completely  -fever equal to or higher than 101 degrees Farenheit.  -cloudy urine with a strong, foul odor  -severe pain  Females should always wipe from front to back after elimination.  You may feel some burning pain when you urinate.  This should disappear with time.  Applying moist heat to the lower abdomen or a hot tub bath may help relieve the pain. \  Apply the triple antibiotic ointment to the penis 3 x daily until healed.    No sexual activity for 2 weeks and then condoms are necessary until we have completed your treatment.       Genital Warts Genital warts are a common STD (sexually transmitted disease). They may appear as small bumps on the tissues of the genital area or anal area. Sometimes, they can become irritated and cause pain. Genital warts are easily passed to other people through sexual contact. Getting treatment is important because genital warts can lead to other problems. In females, the virus that causes genital warts may increase the risk of cervical cancer. What are the causes? Genital warts are caused by a virus that is called human papillomavirus (HPV). HPV is spread by having unprotected sex with an infected person. It can be spread through vaginal, anal, and oral sex.  Many people do not know that they are infected. They may be infected for years without problems. However, even if they do not have problems, they can pass the infection to their sexual partners. What increases the risk? Genital warts are more likely to develop in:  People who have unprotected sex.  People who have multiple sexual partners.  People who become sexually active before they are 28 years of age.  Men who are not circumcised.  Women who have a male sexual partner who is not circumcised.  People who have a weakened body defense system (immune system) due to disease or medicine.  People who smoke.  What are the signs or symptoms? Symptoms of genital warts include:  Small growths in the genital area or anal area. These warts often grow in clusters.  Itching and irritation in the genital area or anal area.  Bleeding from the warts.  Painful sexual intercourse.  How is this diagnosed? Genital warts can usually be diagnosed from their appearance on the vagina, vulva, penis, perineum, anus, or rectum. Tests may also be done, such as:  Biopsy. A tissue sample is removed so it can be looked at under a microscope.  Colposcopy. In females, a magnifying tool is used to examine the vagina and cervix. Certain solutions may be used to make the HPV cells change color so they can be seen more easily.  A Pap test in females.  Tests for other STDs.  How is  this treated? Treatment for genital warts may include:  Applying prescription medicines to the warts. These may be solutions or creams.  Freezing the warts with liquid nitrogen (cryotherapy).  Burning the warts with: ? Laser treatment. ? An electrified probe (electrocautery).  Injecting a substance (Candida antigen or Trichophyton antigen) into the warts to help the body's immune system to fight off the warts.  Interferon injections.  Surgery to remove the warts.  Follow these instructions at  home: Medicines  Apply over-the-counter and prescription medicines only as told by your health care provider.  Do not treat genital warts with medicines that are used for treating hand warts.  Talk with your health care provider about using over-the-counter anti-itch creams. General instructions  Do not touch or scratch the warts.  Do not have sex until your treatment has been completed.  Tell your current and past sexual partners about your condition because they may also need treatment.  Keep all follow-up visits as told by your health care provider. This is important.  After treatment, use condoms during sex to prevent future infections. Other Instructions for Women  Women who have genital warts might need increased screening for cervical cancer. This type of cancer is slow growing and can be cured if it is found early. Chances of developing cervical cancer are increased with HPV.  If you become pregnant, tell your health care provider that you have had HPV. Your health care provider will monitor you closely during pregnancy to be sure that your baby is safe. How is this prevented? Talk with your health care provider about getting the HPV vaccines. These vaccines prevent some HPV infections and cancers. It is recommended that the vaccine be given to males and females who are 41-43 years of age. It will not work if you already have HPV, and it is not recommended for pregnant women. Contact a health care provider if:  You have redness, swelling, or pain in the area of the treated skin.  You have a fever.  You feel generally ill.  You feel lumps in and around your genital area or anal area.  You have bleeding in your genital area or anal area.  You have pain during sexual intercourse.  The warts went up into the urethra and couldn't be completely eliminated with the laser.   We will need to give you a medical treatment with a solution in the penis to eliminate the rest of the  disease.  This information is not intended to replace advice given to you by your health care provider. Make sure you discuss any questions you have with your health care provider. Document Released: 07/06/2000 Document Revised: 12/15/2015 Document Reviewed: 10/04/2014 Elsevier Interactive Patient Education  2018 [employer].   Post Anesthesia Home Care Instructions  Activity: Get plenty of rest for the remainder of the day. A responsible individual must stay with you for 24 hours following the procedure.  For the next 24 hours, DO NOT: -Drive a car -Advertising copywriter -Drink alcoholic beverages -Take any medication unless instructed by your physician -Make any legal decisions or sign important papers.  Meals: Start with liquid foods such as gelatin or soup. Progress to regular foods as tolerated. Avoid greasy, spicy, heavy foods. If nausea and/or vomiting occur, drink only clear liquids until the nausea and/or vomiting subsides. Call your physician if vomiting continues.  Special Instructions/Symptoms: Your throat may feel dry or sore from the anesthesia or the breathing tube placed in your throat during surgery. If  this causes discomfort, gargle with warm salt water. The discomfort should disappear within 24 hours.  If you had a scopolamine patch placed behind your ear for the management of post- operative nausea and/or vomiting:  1. The medication in the patch is effective for 72 hours, after which it should be removed.  Wrap patch in a tissue and discard in the trash. Wash hands thoroughly with soap and water. 2. You may remove the patch earlier than 72 hours if you experience unpleasant side effects which may include dry mouth, dizziness or visual disturbances. 3. Avoid touching the patch. Wash your hands with soap and water after contact with the patch.

## 2018-03-13 NOTE — Interval H&P Note (Signed)
History and Physical Interval Note:  Consent reviewed.   03/13/2018 9:52 AM  Edwin Ortega  has presented today for surgery, with the diagnosis of URETHRAL MEATAL CONDYLOMA  The various methods of treatment have been discussed with the patient and family. After consideration of risks, benefits and other options for treatment, the patient has consented to  Procedure(s): CYSTOSCOPY (N/A) LASER ABLATION OF CONDYLOMA (N/A) as a surgical intervention .  The patient's history has been reviewed, patient examined, no change in status, stable for surgery.  I have reviewed the patient's chart and labs.  Questions were answered to the patient's satisfaction.     Bjorn PippinJohn Joanna Borawski

## 2018-03-13 NOTE — Anesthesia Preprocedure Evaluation (Signed)
Anesthesia Evaluation  Patient identified by MRN, date of birth, ID band Patient awake    Reviewed: Allergy & Precautions, NPO status , Patient's Chart, lab work & pertinent test results  Airway Mallampati: II  TM Distance: >3 FB Neck ROM: Full    Dental no notable dental hx. (+) Dental Advisory Given   Pulmonary neg pulmonary ROS,    Pulmonary exam normal        Cardiovascular negative cardio ROS Normal cardiovascular exam     Neuro/Psych PSYCHIATRIC DISORDERS Anxiety negative neurological ROS  negative psych ROS   GI/Hepatic negative GI ROS, Neg liver ROS,   Endo/Other  negative endocrine ROS  Renal/GU negative Renal ROS  negative genitourinary   Musculoskeletal negative musculoskeletal ROS (+)   Abdominal   Peds negative pediatric ROS (+)  Hematology negative hematology ROS (+)   Anesthesia Other Findings   Reproductive/Obstetrics negative OB ROS                             Anesthesia Physical Anesthesia Plan  ASA: II  Anesthesia Plan: General   Post-op Pain Management:    Induction: Intravenous  PONV Risk Score and Plan: 2 and Ondansetron and Dexamethasone  Airway Management Planned: LMA  Additional Equipment:   Intra-op Plan:   Post-operative Plan: Extubation in OR  Informed Consent: I have reviewed the patients History and Physical, chart, labs and discussed the procedure including the risks, benefits and alternatives for the proposed anesthesia with the patient or authorized representative who has indicated his/her understanding and acceptance.   Dental advisory given  Plan Discussed with: CRNA and Anesthesiologist  Anesthesia Plan Comments:         Anesthesia Quick Evaluation

## 2018-03-13 NOTE — Op Note (Signed)
Procedure: 1.  Cystourethroscopy with laser fulguration of intra-urethral condyloma. 2.  Excisional biopsy of meatal condyloma.  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  Surgeon: Dr. Bjorn PippinJohn Audrianna Ortega.  Anesthesia: General.  Specimen: Meatal condyloma.  Drain: None.  EBL: None.  Complications: None.  Indications: Edwin Ortega is a 27 year old African-American male who presented to the office with what he thought was a urethral injury but previous to the condyloma the ventral meatus.  He presents today for ureteroscopy along with biopsy and fulguration of the condyloma.  Procedure: He was taken operating room where he was given 2 g of Ancef.  General anesthetic was induced and he was placed in lithotomy position and fitted with PSAs.  His perineum and genitalia were prepped with Betadine solution was draped in usual sterile fashion.  Inspection demonstrated condyloma the ventral urethra is noted in the office.  Flexible cystoscopy was then performed which unfortunately demonstrated additional papillary lesions consistent with intraurethral condyloma extending 2 to 3 cm into the fossa navicularis.  There were nonobstructing.  There were some cobblestoned changes of the mucosa which is worrisome for subclinical disease.  The remainder of the penile, bulbar and membranous urethra are unremarkable.  The prostatic urethra short without obstruction.  The bladder wall was smooth and pale without tumor stones or inflammation and ureteral orifices were in the normal anatomic position.  I then used pickups and tenotomy scissors to excise 1 of the larger condyloma from the meatus to send for pathologic analysis.  A 500 m holmium laser fiber set on 0.5 W and 10 Hz was used to ablate the remaining meatal condyloma.  I then used a 23 [place] rigid cystoscope with 30 degree lens in the 500 m laser fiber to ablate several of the lesions in the fossa  navicularis.  I then switched to the 17 [place] un-beaked cystoscope sheath which allowed more distal treatment of the lesions.  Several additional condyloma were fulgurated.  At this point I felt I had ablated all of the visible lesions that remain concerned about the mucosal changes adjacent to the lesions.  The scope was then advanced the bladder and the bladder was drained.  The scope was then removed the urethral meatus was then coated with Neosporin ointment.  His anesthetic was reversed and he was taken down from the lithotomy position and moved to recovery room in stable condition.  There were no complications.  He will need intraurethral therapy with 5-FU when he has healed from today's procedure.

## 2018-03-13 NOTE — Transfer of Care (Signed)
Immediate Anesthesia Transfer of Care Note  Patient: Edwin Ortega  Procedure(s) Performed: CYSTOSCOPY (N/A Bladder) LASER ABLATION OF CONDYLOMA (N/A Urethra)  Patient Location: PACU  Anesthesia Type:General  Level of Consciousness: sedated  Airway & Oxygen Therapy: Patient Spontanous Breathing and Patient connected to face mask oxygen  Post-op Assessment: Report given to RN and Post -op Vital signs reviewed and stable  Post vital signs: Reviewed and stable  Last Vitals:  Vitals Value Taken Time  BP    Temp    Pulse 64 03/13/2018 10:54 AM  Resp 15 03/13/2018 10:54 AM  SpO2 100 % 03/13/2018 10:54 AM  Vitals shown include unvalidated device data.  Last Pain:  Vitals:   03/13/18 0826  TempSrc:   PainSc: 0-No pain      Patients Stated Pain Goal: 5 (03/13/18 0826)  Complications: No apparent anesthesia complications

## 2018-03-13 NOTE — Anesthesia Procedure Notes (Signed)
Procedure Name: LMA Insertion Date/Time: 03/13/2018 10:06 AM Performed by: Caren Macadamarter, Melvena Vink W, CRNA Pre-anesthesia Checklist: Patient identified, Emergency Drugs available, Suction available and Patient being monitored Patient Re-evaluated:Patient Re-evaluated prior to induction Oxygen Delivery Method: Circle system utilized Preoxygenation: Pre-oxygenation with 100% oxygen Induction Type: IV induction Ventilation: Mask ventilation without difficulty LMA: LMA inserted LMA Size: 5.0 Number of attempts: 1 Placement Confirmation: positive ETCO2 and breath sounds checked- equal and bilateral Tube secured with: Tape Dental Injury: Teeth and Oropharynx as per pre-operative assessment

## 2018-03-13 NOTE — Anesthesia Postprocedure Evaluation (Signed)
Anesthesia Post Note  Patient: Edwin Ortega  Procedure(s) Performed: CYSTOSCOPY (N/A Bladder) LASER ABLATION OF CONDYLOMA (N/A Urethra)     Patient location during evaluation: PACU Anesthesia Type: General Level of consciousness: sedated Pain management: pain level controlled Vital Signs Assessment: post-procedure vital signs reviewed and stable Respiratory status: spontaneous breathing and respiratory function stable Cardiovascular status: stable Postop Assessment: no apparent nausea or vomiting Anesthetic complications: no    Last Vitals:  Vitals:   03/13/18 1124 03/13/18 1145  BP:  125/88  Pulse: (!) 59 63  Resp: 13 17  Temp:  36.5 C  SpO2: 100% 100%    Last Pain:  Vitals:   03/13/18 1145  TempSrc:   PainSc: 2                  Willine Schwalbe DANIEL

## 2018-03-14 ENCOUNTER — Encounter (HOSPITAL_BASED_OUTPATIENT_CLINIC_OR_DEPARTMENT_OTHER): Payer: Self-pay | Admitting: Urology
